# Patient Record
Sex: Male | Born: 1985 | Race: Black or African American | Hispanic: No | Marital: Single | Smoking: Current some day smoker
Health system: Southern US, Community
[De-identification: ages and names within clinical notes are randomized; demographics above are authoritative.]

---

## 2017-01-17 ENCOUNTER — Emergency Department (HOSPITAL_COMMUNITY): Payer: Self-pay

## 2017-01-17 ENCOUNTER — Emergency Department (HOSPITAL_COMMUNITY)
Admission: EM | Admit: 2017-01-17 | Discharge: 2017-01-17 | Disposition: A | Discharge status: Home / Self Care | Payer: Self-pay | Attending: Emergency Medicine | Admitting: Emergency Medicine

## 2017-01-17 ENCOUNTER — Encounter (HOSPITAL_COMMUNITY): Payer: Self-pay

## 2017-01-17 DIAGNOSIS — F17200 Nicotine dependence, unspecified, uncomplicated: Secondary | ICD-10-CM | POA: Insufficient documentation

## 2017-01-17 DIAGNOSIS — R0789 Other chest pain: Secondary | ICD-10-CM | POA: Insufficient documentation

## 2017-01-17 MED ORDER — NAPROXEN 500 MG PO TABS: 500.0000 mg | ORAL_TABLET | Freq: Two times a day (BID) | ORAL | 0 refills | Status: DC

## 2017-01-17 NOTE — Discharge Instructions (Signed)
Your EKG and your chest x-ray today were normal. Take ibuprofen as needed for pain. Call [facility] to schedule an appointment for a complete physical and further evaluation. Return here as needed.

## 2017-01-17 NOTE — ED Notes (Signed)
Patient transported to X-ray 

## 2017-01-17 NOTE — ED Provider Notes (Signed)
[facility]EM[facility] DEPT Provider Note   CSN: [id number] Arrival date & time: 01/17/17  1823  By signing my name below, I, Phillips Climes, attest that this documentation has been prepared under the direction and in the presence of Kerrie Buffalo, NP.  Electronically Signed: Phillips Climes, Scribe. 01/22/2017. 6:52 PM.  History   Chief Complaint Chief Complaint  Patient presents with  . right sided CP x 3 years   HPI Comments Damany Cardosa is a 31 y.o. male with no reported PMHx, who presents to the Emergency Department with complaints of constant right sided chest pain x1 wk.  Pain described as tightness, worse when pt is bending down or moving.   No recent injury or trauma.  Pt states that sx have been occurring in episodes over the last x3 yrs, worse in the last several months.  No nausea vomiting, fevers, chills or shortness of breath.  Pt denies experiencing any other acute sx.  He denies illicit drug use.  The history is provided by the patient. No language interpreter was used.   History reviewed. No pertinent past medical history.  There are no active problems to display for this patient.  History reviewed. No pertinent surgical history.  Home Medications    Prior to Admission medications   Medication Sig Start Date End Date Taking? Authorizing Provider  naproxen (NAPROSYN) 500 MG tablet Take 1 tablet (500 mg total) by mouth 2 (two) times daily. 01/17/17   Janne Napoleon, NP   Family History No family history on file.  Social History Social History  Substance Use Topics  . Smoking status: Current Some Day Smoker  . Smokeless tobacco: Never Used  . Alcohol use Not on file   Allergies   Patient has no allergy information on record.  Review of Systems Review of Systems  Constitutional: Negative for chills and fever.  Respiratory: Negative for shortness of breath.   Cardiovascular: Positive for chest pain.  Gastrointestinal: Negative for nausea and vomiting.    Physical  Exam Updated Vital Signs BP 98/63 (BP Location: Left Arm)   Pulse (!) 51   Temp 98 F (36.7 C) (Oral)   Resp 16   SpO2 99%   Physical Exam  Constitutional: He appears well-developed and well-nourished. No distress.  HENT:  Head: Normocephalic and atraumatic.  Eyes: EOM are normal.  Neck: Trachea normal and normal range of motion. Neck supple. No JVD present.  Cardiovascular: Normal rate, regular rhythm, normal heart sounds and intact distal pulses.   Pulmonary/Chest: Effort normal and breath sounds normal. Left breast exhibits tenderness.  Abdominal: Soft. Bowel sounds are normal. There is no tenderness.  Musculoskeletal: Normal range of motion.  No CVA tenderness.  Neurological: He is alert.  Skin: Skin is warm and dry.  Psychiatric: He has a normal mood and affect.  Nursing note and vitals reviewed.  ED Treatments / Results  DIAGNOSTIC STUDIES: Oxygen Saturation is 99% on room air, normal by my interpretation.    COORDINATION OF CARE: 8:14 PM Discussed treatment plan with pt at bedside and pt agreed to plan.  Labs (all labs ordered are listed, but only abnormal results are displayed) Labs Reviewed - No data to display  EKG  EKG Interpretation  Date/Time:  Wednesday January 17 2017 18:29:01 EDT Ventricular Rate:  63 PR Interval:  148 QRS Duration: 84 QT Interval:  390 QTC Calculation: 399 R Axis:   67 Text Interpretation:  Normal sinus rhythm Normal ECG No old tracing to compare Confirmed  by Pricilla LovelessGoldston, Scott [phone]) on 01/18/2017 6:03:22 PM      Radiology No results found.  Procedures Procedures (including critical care time)  Medications Ordered in ED Medications - No data to display   Initial Impression / Assessment and Plan / ED Course  I have reviewed the triage vital signs and the nursing notes.  Patient is to be discharged with recommendation to follow up with PCP in regards to today's hospital visit. Chest pain is not likely of cardiac or pulmonary  etiology d/t presentation, perc negative, VSS, no tracheal deviation, no JVD or new murmur, RRR, breath sounds equal bilaterally, EKG without acute abnormalities, negative CXR. Pt has been advised start a PPI and return to the ED is CP becomes exertional, associated with diaphoresis or nausea, radiates to left jaw/arm, worsens or becomes concerning in any way. Pt appears reliable for follow up and is agreeable to discharge.  I personally performed the services described in this documentation, which was scribed in my presence. The recorded information has been reviewed and is accurate.   Final Clinical Impressions(s) / ED Diagnoses   Final diagnoses:  Chest wall pain    New Prescriptions There are no discharge medications for this patient.    Kerrie Buffaloeese, Hope LymanM, [place]NP 01/22/17 [id number]    Loren RacerYelverton, David, MD 01/25/17 1144

## 2017-01-17 NOTE — ED Triage Notes (Signed)
Patient complains of right anterior CP that hurts more when he moves, describes as a catch. No cough, no trauma. Does lift weights. States has had the pain intermittent x 3 years and 2 days for this episode

## 2017-03-10 ENCOUNTER — Emergency Department (HOSPITAL_COMMUNITY): Payer: No Typology Code available for payment source

## 2017-03-10 ENCOUNTER — Encounter (HOSPITAL_COMMUNITY): Payer: Self-pay | Admitting: *Deleted

## 2017-03-10 ENCOUNTER — Emergency Department (HOSPITAL_COMMUNITY)
Admission: EM | Admit: 2017-03-10 | Discharge: 2017-03-10 | Disposition: A | Discharge status: Home / Self Care | Payer: No Typology Code available for payment source | Attending: Emergency Medicine | Admitting: Emergency Medicine

## 2017-03-10 DIAGNOSIS — Z043 Encounter for examination and observation following other accident: Secondary | ICD-10-CM | POA: Insufficient documentation

## 2017-03-10 DIAGNOSIS — F17200 Nicotine dependence, unspecified, uncomplicated: Secondary | ICD-10-CM | POA: Diagnosis not present

## 2017-03-10 MED ORDER — NAPROXEN 500 MG PO TABS: 500.0000 mg | ORAL_TABLET | Freq: Two times a day (BID) | ORAL | 0 refills | Status: AC

## 2017-03-10 MED ORDER — METHOCARBAMOL 500 MG PO TABS: 500.0000 mg | ORAL_TABLET | Freq: Every evening | ORAL | 0 refills | Status: AC | PRN

## 2017-03-10 NOTE — ED Triage Notes (Signed)
Pt returned to his room.

## 2017-03-10 NOTE — ED Notes (Signed)
Patient transported to X-ray 
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
## 2017-03-10 NOTE — ED Triage Notes (Signed)
Pt at staff desk reporting he wanted to come back later for his DC papers.  Pt informed  The results from x-rays have not resulted. Pt requested  We fax the DC papers to his lawyer when the results are ready. Pt instructed his request could not be done. Pt then reported he would just come back later to pick up DC papers because his lawyer needs the DC  Papers. Pt instructed his room would not be saved for him. If he leaves before the results are posted he would need to check in thur triage.
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
## 2017-03-10 NOTE — ED Notes (Signed)
Declined W/C at D/C and was escorted to lobby by RN. 
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
## 2017-04-22 ENCOUNTER — Encounter (HOSPITAL_COMMUNITY): Payer: Self-pay | Admitting: Emergency Medicine

## 2017-04-22 ENCOUNTER — Emergency Department (HOSPITAL_COMMUNITY)
Admission: EM | Admit: 2017-04-22 | Discharge: 2017-04-22 | Disposition: A | Discharge status: Home / Self Care | Payer: Self-pay | Attending: Emergency Medicine | Admitting: Emergency Medicine

## 2017-04-22 DIAGNOSIS — N481 Balanitis: Secondary | ICD-10-CM | POA: Insufficient documentation

## 2017-04-22 DIAGNOSIS — Z79899 Other long term (current) drug therapy: Secondary | ICD-10-CM | POA: Insufficient documentation

## 2017-04-22 DIAGNOSIS — F17200 Nicotine dependence, unspecified, uncomplicated: Secondary | ICD-10-CM | POA: Insufficient documentation

## 2017-04-22 DIAGNOSIS — K047 Periapical abscess without sinus: Secondary | ICD-10-CM | POA: Insufficient documentation

## 2017-04-22 MED ORDER — KETOCONAZOLE 2 % EX CREA: 1.0000 "application " | TOPICAL_CREAM | Freq: Every day | CUTANEOUS | 0 refills | Status: AC

## 2017-04-22 MED ORDER — AMOXICILLIN 500 MG PO CAPS: 500.0000 mg | ORAL_CAPSULE | Freq: Three times a day (TID) | ORAL | 0 refills | Status: AC

## 2017-04-22 NOTE — ED Triage Notes (Signed)
Pt. Stated, I have a bad tooth for about 8 months , it comes and goes.  Im also have some type of bumps or something on my penis.

## 2017-04-22 NOTE — ED Provider Notes (Signed)
[facility] Provider Note   CSN: [id number] Arrival date & time: 04/22/17  4098     History   Chief Complaint Chief Complaint  Patient presents with  . Dental Problem  . Groin Swelling    HPI Joseph Gould is a 31 y.o. Gould. Chief complaint is 1. Dental infection.  2.  Recurrent penis bump  HPI:  Joseph Gould. States he moved here from [place] this summer. He had an area on his penis like he has now, last winter. In November he states he had "all the blood tests". He was treated with ketoconazole and his symptoms resolved. He states it has recurred. Is on the tip of his penis, and underneath the glans. He does not have swelling or tenderness in his groin or palpable lymph nodes. No drainage. No dysuria. He also states that he has a tooth that has a piece missing that review currently gets painful and swollen. States is been sent back for about 2 weeks.  History reviewed. No pertinent past medical history.  There are no active problems to display for this patient.   History reviewed. No pertinent surgical history.     Home Medications    Prior to Admission medications   Medication Sig Start Date End Date Taking? Authorizing Provider  amoxicillin (AMOXIL) 500 MG capsule Take 1 capsule (500 mg total) by mouth 3 (three) times daily. 04/22/17   Rolland Porter, MD  ketoconazole (NIZORAL) 2 % cream Apply 1 application topically daily. 04/22/17   Rolland Porter, MD  methocarbamol (ROBAXIN) 500 MG tablet Take 1 tablet (500 mg total) by mouth at bedtime as needed for muscle spasms. 03/10/17   Mathews Robinsons B, PA-C  naproxen (NAPROSYN) 500 MG tablet Take 1 tablet (500 mg total) by mouth 2 (two) times daily with a meal. 03/10/17   Georgiana Shore, PA-C    Family History No family history on file.  Social History Social History  Substance Use Topics  . Smoking status: Current Some Day Smoker  . Smokeless tobacco: Never Used  . Alcohol use Yes     Comment: occ      Allergies   Patient has no known allergies.   Review of Systems Review of Systems  Constitutional: Negative for appetite change, chills, diaphoresis, fatigue and fever.  HENT: Positive for dental problem. Negative for mouth sores, sore throat and trouble swallowing.   Eyes: Negative for visual disturbance.  Respiratory: Negative for cough, chest tightness, shortness of breath and wheezing.   Cardiovascular: Negative for chest pain.  Gastrointestinal: Negative for abdominal distention, abdominal pain, diarrhea, nausea and vomiting.  Endocrine: Negative for polydipsia, polyphagia and polyuria.  Genitourinary: Negative for dysuria, frequency and hematuria.       Penile lesion  Musculoskeletal: Negative for gait problem.  Skin: Negative for color change, pallor and rash.  Neurological: Negative for dizziness, syncope, light-headedness and headaches.  Hematological: Does not bruise/bleed easily.  Psychiatric/Behavioral: Negative for behavioral problems and confusion.     Physical Exam Updated Vital Signs BP 109/67 (BP Location: Right Arm)   Pulse 65   Temp 98.3 F (36.8 C) (Oral)   Resp Joseph   Ht  (1.778 m)   Wt 70.3 kg (155 lb)   SpO2 100%   BMI 22.24 kg/m   Physical Exam  Constitutional: He is oriented to person, place, and time. He appears well-developed and well-nourished. No distress.  HENT:  Head: Normocephalic.  Tooth #3 mesial cusp is absent. No periodontal swelling  noted. Tender to palpate with template.  Eyes: Pupils are equal, round, and reactive to light. Conjunctivae are normal. No scleral icterus.  Neck: Normal range of motion. Neck supple. No thyromegaly present.  Cardiovascular: Normal rate and regular rhythm.  Exam reveals no gallop and no friction rub.   No murmur heard. Pulmonary/Chest: Effort normal and breath sounds normal. No respiratory distress. He has no wheezes. He has no rales.  Abdominal: Soft. Bowel sounds are normal. He exhibits no  distension. There is no tenderness. There is no rebound.  Genitourinary:  Genitourinary Comments: 1 small plaque with some peeling of the overlying skin on the glans. He had some similar appearing lesions are flat distal shaft just of the glans. These appear somewhat scaly undersurfaces well. No palpable lymphadenopathy. No elevated lesions. These are nontender and nonpainful to him.  Musculoskeletal: Normal range of motion.  Neurological: He is alert and oriented to person, place, and time.  Skin: Skin is warm and dry. No rash noted.  Psychiatric: He has a normal mood and affect. His behavior is normal.     ED Treatments / Results  Labs (all labs ordered are listed, but only abnormal results are displayed) Labs Reviewed  RPR    EKG  EKG Interpretation None       Radiology No results found.  Procedures Procedures (including critical care time)  Medications Ordered in ED Medications - No data to display   Initial Impression / Assessment and Plan / ED Course  I have reviewed the triage vital signs and the nursing notes.  Pertinent labs & imaging results that were available during my care of the patient were reviewed by me and considered in my medical decision making (see chart for details).    First diagnosis would include syphilis. He was treated with ketoconazole that better last fall. This may have been simple fungal balanitis. He may have had syphilis testing during his primary chancre which would have been negative even if this were syphilis. 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  Will repeat RPR testing. Amoxicillin for his tooth. Ketoconazole for the lesion. Doubt chancroid is nonpainful. Not herpetic. No adenopathy. Thus doubt other STD.  Final Clinical Impressions(s) / ED Diagnoses   Final diagnoses:  Balanitis  Dental infection    New Prescriptions New Prescriptions   AMOXICILLIN (AMOXIL) 500 MG CAPSULE    Take 1 capsule (500 mg total)  by mouth 3 (three) times daily.   KETOCONAZOLE (NIZORAL) 2 % CREAM    Apply 1 application topically daily.     Rolland Porter, MD 04/22/17 [phone]

## 2017-04-22 NOTE — Discharge Instructions (Signed)
Your blood has been drawn to test for syphilis. If this test is positive, you will be called, and asked to come back to the hospital or health department for treatment.  See a dentist as soon as possible.

## 2017-04-23 LAB — RPR: RPR Ser Ql: NONREACTIVE
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
## 2017-09-30 ENCOUNTER — Encounter (HOSPITAL_COMMUNITY): Payer: Self-pay | Admitting: Emergency Medicine

## 2017-09-30 ENCOUNTER — Emergency Department (HOSPITAL_COMMUNITY)
Admission: EM | Admit: 2017-09-30 | Discharge: 2017-09-30 | Disposition: A | Discharge status: Home / Self Care | Payer: PRIVATE HEALTH INSURANCE | Attending: Emergency Medicine | Admitting: Emergency Medicine

## 2017-09-30 DIAGNOSIS — K0889 Other specified disorders of teeth and supporting structures: Secondary | ICD-10-CM | POA: Diagnosis not present

## 2017-09-30 DIAGNOSIS — R519 Headache, unspecified: Secondary | ICD-10-CM

## 2017-09-30 DIAGNOSIS — R51 Headache: Secondary | ICD-10-CM | POA: Insufficient documentation

## 2017-09-30 DIAGNOSIS — F17210 Nicotine dependence, cigarettes, uncomplicated: Secondary | ICD-10-CM | POA: Insufficient documentation

## 2017-09-30 MED ORDER — AMOXICILLIN-POT CLAVULANATE 875-125 MG PO TABS: 1.0000 | ORAL_TABLET | Freq: Two times a day (BID) | ORAL | 0 refills | Status: AC

## 2017-09-30 MED ORDER — FLUTICASONE PROPIONATE 50 MCG/ACT NA SUSP: 1.0000 | Freq: Every day | NASAL | 2 refills | Status: AC

## 2017-09-30 NOTE — ED Notes (Signed)
Declined W/C at D/C and was escorted to lobby by RN. 

## 2017-09-30 NOTE — ED Provider Notes (Signed)
[facility] EMERGENCY DEPARTMENT Provider Note   CSN: [id number] Arrival date & time: 09/30/17  1611     History   Chief Complaint Chief Complaint  Patient presents with  . Facial Pain    HPI Joseph Gould is a 32 y.o. male who presents for evaluation of 2 weeks of facial pain and pressure.  He reports that the most notably on the left side.  He states that he will also occasionally have pressure headache.  He states that he has been having some runny nose and some intermittent pressure to the nose and under the bilateral eyes.  He states that he is also had an area in his left upper gum that feels like it is been painful.  He has not noticed any redness or swelling of the gums.  States he has not had any redness or swelling of the face.  He has not taken any medications for his symptoms.  He currently denies any headache at this time.  Patient denies any fevers, neck swelling, difficulty swallowing, difficulty breathing, nausea/vomiting, chest pain, vomiting.  The history is provided by the patient.    History reviewed. No pertinent past medical history.  There are no active problems to display for this patient.   No past surgical history on file.     Home Medications    Prior to Admission medications   Medication Sig Start Date End Date Taking? Authorizing Provider  amoxicillin (AMOXIL) 500 MG capsule Take 1 capsule (500 mg total) by mouth 3 (three) times daily. 04/22/17   Rolland Porter, MD  amoxicillin-clavulanate (AUGMENTIN) 875-125 MG tablet Take 1 tablet by mouth every 12 (twelve) hours. 09/30/17   Maxwell Caul, PA-C  fluticasone (FLONASE) 50 MCG/ACT nasal spray Place 1 spray into both nostrils daily. 09/30/17   Maxwell Caul, PA-C  ketoconazole (NIZORAL) 2 % cream Apply 1 application topically daily. 04/22/17   Rolland Porter, MD  methocarbamol (ROBAXIN) 500 MG tablet Take 1 tablet (500 mg total) by mouth at bedtime as needed for muscle spasms.  03/10/17   Mathews Robinsons B, PA-C  naproxen (NAPROSYN) 500 MG tablet Take 1 tablet (500 mg total) by mouth 2 (two) times daily with a meal. 03/10/17   Georgiana Shore, PA-C    Family History No family history on file.  Social History Social History   Tobacco Use  . Smoking status: Current Some Day Smoker  . Smokeless tobacco: Never Used  Substance Use Topics  . Alcohol use: Yes    Comment: occ  . Drug use: No     Allergies   Patient has no known allergies.   Review of Systems Review of Systems  Constitutional: Negative for chills and fever.  HENT: Positive for congestion, dental problem, rhinorrhea, sinus pressure and sinus pain.        Facial pain  Respiratory: Negative for cough and shortness of breath.   Cardiovascular: Negative for chest pain.  Gastrointestinal: Negative for abdominal pain and vomiting.  Skin: Negative for rash.  Neurological: Negative for headaches.     Physical Exam Updated Vital Signs BP 110/76   Pulse 69   Temp 98.6 F (37 C) (Oral)   Resp 18   SpO2 100%   Physical Exam  Constitutional: He appears well-developed and well-nourished.  HENT:  Head: Normocephalic and atraumatic.  Right Ear: Tympanic membrane normal.  Left Ear: Tympanic membrane normal.  Nose: Mucosal edema and rhinorrhea present. No nasal septal hematoma. Right sinus exhibits maxillary  sinus tenderness. Left sinus exhibits maxillary sinus tenderness.  Mouth/Throat: Uvula is midline, oropharynx is clear and moist and mucous membranes are normal. No dental abscesses.  Patient has partial dentures to the upper dentition.  Dentures removed.  He is having some pain at tooth number 12 which correlates to where his dentures sit.  There appears to be a small area of irritation/abrasion noted to the gum where the dentures comments that.  No surrounding gingival erythema, fluctuance.  No identifiable dental abscess.  Uvula is midline.  No trismus.  No neck or facial swelling.    Eyes: Conjunctivae and EOM are normal. Right eye exhibits no discharge. Left eye exhibits no discharge. No scleral icterus.  Pulmonary/Chest: Effort normal and breath sounds normal.  Neurological: He is alert.  Skin: Skin is warm and dry.  Psychiatric: He has a normal mood and affect. His speech is normal and behavior is normal.  Nursing note and vitals reviewed.    ED Treatments / Results  Labs (all labs ordered are listed, but only abnormal results are displayed) Labs Reviewed - No data to display  EKG  EKG Interpretation None       Radiology No results found.  Procedures Procedures (including critical care time)  Medications Ordered in ED Medications - No data to display   Initial Impression / Assessment and Plan / ED Course  I have reviewed the triage vital signs and the nursing notes.  Pertinent labs & imaging results that were available during my care of the patient were reviewed by me and considered in my medical decision making (see chart for details).     32 year old male who presents for evaluation of 2 weeks of facial pain and pressure, left greater than right.  Has had some associated nasal congestion, rhinorrhea.  Also reports having some pain to a left upper tooth.  He does have partial dentures.  No fevers, neck swelling, facial swelling, difficulty swallowing. Patient is afebrile, non-toxic appearing, sitting comfortably on examination table. Vital signs reviewed and stable.  On exam, patient does have some maxillary sinus pressure.  Nose shows some nasal mucosal edema but otherwise unremarkable.  Partial dental was removed and it appears that patient has a small abrasion next to #12.  No identifiable dental abscess seen that would require drainage in the emergency department.  History/physical exam is not concerning for Ludwig angina or peritonsillar abscess.  Unclear if patient's symptoms are result of dental pain or result of sinus pressure as he does  exhibit some tenderness to the maxillary sinus.  Given that this is been ongoing for 2 weeks, will plan to start patient on antibiotics to cover for both dental abscess and sinusitis.  Will also give additional supportive therapies to help with any congestion.  Patient instructed to follow-up with dental clinics and [facility] for further evaluation. Patient had ample opportunity for questions and discussion. All patient's questions were answered with full understanding. Strict return precautions discussed. Patient expresses understanding and agreement to plan.    Final Clinical Impressions(s) / ED Diagnoses   Final diagnoses:  Facial pain    ED Discharge Orders        Ordered    amoxicillin-clavulanate (AUGMENTIN) 875-125 MG tablet  Every 12 hours     09/30/17 1746    fluticasone (FLONASE) 50 MCG/ACT nasal spray  Daily     09/30/17 1746       Rosana HoesLayden, Kasumi Ditullio A, PA-C 09/30/17 [place]    Pickering, Nathan, MD 10/01/17  0011  

## 2017-09-30 NOTE — Discharge Instructions (Signed)
As we discussed, it is unclear if her facial pain is being caused by sinus infection or a dental infection.  You can take Tylenol or Ibuprofen as directed for pain. You can alternate Tylenol and Ibuprofen every 4 hours. If you take Tylenol at 1pm, then you can take Ibuprofen at 5pm. Then you can take Tylenol again at 9pm. Do not exceed 4000 mg of tylenol a day. Do not exceed 12000 mg of ibuprofen a day.  Take flonase as directed.   Take antibiotics as directed. Please take all of your antibiotics until finished.  Follow-up with the referred dental clinics.   Return to the Emergency Department for any fever, worsening pain, facial swelling or redness, difficulty breathing, swallowing, chest pain, or any other worsening or concerning symptoms.    Please follow-up with one of the dental clinics provided to you below or in your paperwork. Call and tell them you were seen in the Emergency Dept and arrange for an appointment. You may have to call multiple places in order to find a place to be seen.  Dental Assistance If the dentist on-call cannot see you, please use the resources below:   Patients with Medicaid: [facility] [phone], [phone] [address], [phone]  If unable to pay, or uninsured, contact [facility] [phone]) or [facility] [phone] in [place], [phone] in [place]) to become qualified for the adult dental clinic  Other Low-Cost Community Dental Services: [facility]- [address], [place], [place], [id number]    [phone], Ext. [phone]    2nd and 4th Thursday of the month at 6:30am    10 clients each day by appointment, can sometimes see walk-in     patients if someone does not show for an appointment [facility]- [address], [place], [place], [id number]    [phone] [facility]- [address], [place], [place], [id number]    [phone]  [facility]- [phone] [facility]- [phone] [facility]- [phone]

## 2017-09-30 NOTE — ED Notes (Signed)
Pt's name called for triage no answer 

## 2017-09-30 NOTE — ED Triage Notes (Signed)
Pt states about 2 weeks of sinus pressure. He does have a runny nose, with pressure to nose and under eyes. Denies cough. Afebrile. Pt also has a tooth ache to left upper.
